# Patient Record
Sex: Male | Born: 1995 | Race: Black or African American | Hispanic: No | Marital: Single | Smoking: Never smoker
Health system: Southern US, Community
[De-identification: ages and names within clinical notes are randomized; demographics above are authoritative.]

## PROBLEM LIST (undated history)

## (undated) HISTORY — PX: WISDOM TOOTH EXTRACTION: SHX21

---
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
## 2018-01-26 ENCOUNTER — Emergency Department (HOSPITAL_BASED_OUTPATIENT_CLINIC_OR_DEPARTMENT_OTHER)
Admission: EM | Admit: 2018-01-26 | Discharge: 2018-01-26 | Disposition: A | Discharge status: Home / Self Care | Payer: PRIVATE HEALTH INSURANCE | Attending: Emergency Medicine | Admitting: Emergency Medicine

## 2018-01-26 ENCOUNTER — Encounter (HOSPITAL_BASED_OUTPATIENT_CLINIC_OR_DEPARTMENT_OTHER): Payer: Self-pay | Admitting: *Deleted

## 2018-01-26 ENCOUNTER — Emergency Department (HOSPITAL_BASED_OUTPATIENT_CLINIC_OR_DEPARTMENT_OTHER): Payer: PRIVATE HEALTH INSURANCE

## 2018-01-26 ENCOUNTER — Other Ambulatory Visit: Payer: Self-pay

## 2018-01-26 DIAGNOSIS — M25562 Pain in left knee: Secondary | ICD-10-CM | POA: Diagnosis not present

## 2018-01-26 MED ORDER — SULFAMETHOXAZOLE-TRIMETHOPRIM 800-160 MG PO TABS: 1.0000 | ORAL_TABLET | Freq: Two times a day (BID) | ORAL | 0 refills | Status: AC

## 2018-01-26 MED ORDER — IBUPROFEN 400 MG PO TABS: 600.0000 mg | ORAL_TABLET | Freq: Once | ORAL | Status: DC

## 2018-01-26 MED ORDER — LIDOCAINE-EPINEPHRINE 2 %-1:100000 IJ SOLN
INTRAMUSCULAR | Status: AC
  Filled 2018-01-26: qty 1

## 2018-01-26 MED ORDER — BENZOCAINE 20 % MT AERO
INHALATION_SPRAY | OROMUCOSAL | Status: AC
  Filled 2018-01-26: qty 57

## 2018-01-26 MED FILL — SULFAMETHOXAZOLE-TMP DS TAB: 800-160 | 5 days supply | Qty: 10 | Fill #0

## 2018-01-26 NOTE — ED Notes (Signed)
ED Provider at bedside. 

## 2018-01-26 NOTE — ED Provider Notes (Addendum)
[facility] EMERGENCY DEPARTMENT Provider Note   CSN: [id number] Arrival date & time: 01/26/18  1040     History   Chief Complaint Chief Complaint  Patient presents with  . Knee Pain    HPI Manuel Ritter is a 22 y.o. male.  HPI 22 year old African-American male with no pertinent past medical history presents to the ED for evaluation of pain, redness and swelling to his left knee.  Patient noted a pimple to his left knee that he popped a few days ago.  Patient states he did have some drainage from the area.  He states that since then he developed some erythema and warmth over his left lateral knee.  He does have some pain with range of motion of the knee.  Denies any systemic symptoms of fever, chills, nausea, vomiting.  She is able to walk and bend his knee however does cause some pain.  He is not taking for the pain prior to arrival.  Range of motion and palpation make the pain worse.  Holding the leg straight makes the pain better.  Denies any associated paresthesias or weakness.  States that he has had this happen in the past that improved on its own. History reviewed. No pertinent past medical history.  There are no active problems to display for this patient.   Past Surgical History:  Procedure Laterality Date  . ORIF MANDIBULAR FRACTURE Bilateral 09/13/2013   Procedure: OPEN REDUCTION MAXILLO-MANDIBULAR FIXATION;  Surgeon: Francene Finders, DDS;  Location: [facility] OR;  Service: Oral Surgery;  Laterality: Bilateral;  . WISDOM TOOTH EXTRACTION     all 4        Home Medications    Prior to Admission medications   Medication Sig Start Date End Date Taking? Authorizing Provider  sulfamethoxazole-trimethoprim (BACTRIM DS,SEPTRA DS) 800-160 MG tablet Take 1 tablet by mouth 2 (two) times daily. 01/26/18   Rise Mu, PA-C    Family History History reviewed. No pertinent family history.  Social History Social History   Tobacco Use  . Smoking status:  Never Smoker  . Smokeless tobacco: Never Used  Substance Use Topics  . Alcohol use: No  . Drug use: No     Allergies   Patient has no known allergies.   Review of Systems Review of Systems  Constitutional: Negative for chills and fever.  Gastrointestinal: Negative for vomiting.  Musculoskeletal: Positive for arthralgias, joint swelling and myalgias.  Skin: Positive for color change and wound.  Neurological: Negative for weakness and numbness.     Physical Exam Updated Vital Signs BP (!) 141/88 (BP Location: Left Arm)   Pulse 99   Temp 99.5 F (37.5 C) (Oral)   Resp 18   Ht 5\' 6"  (1.676 m)   Wt 63.5 kg (140 lb)   SpO2 100%   BMI 22.60 kg/m   Physical Exam  Constitutional: He appears well-developed and well-nourished. No distress.  HENT:  Head: Normocephalic and atraumatic.  Eyes: Right eye exhibits no discharge. Left eye exhibits no discharge. No scleral icterus.  Neck: Normal range of motion.  Pulmonary/Chest: No respiratory distress.  Musculoskeletal: Normal range of motion.  Patient has a small pustule to the left knee without any drainage.  She he does have associated erythema and warmth of the left lateral aspect of the knee.  Patient does have range of motion however does cause some pain.  No redness or warmth on the medial aspect of the knee.  No lower extremity edema or calf  tenderness.  DP pulses 2+ bilaterally.  Sensation intact with brisk cap refill.  There is no area of fluctuance concerning for an abscess.  Neurological: He is alert.  Skin: Skin is warm and dry. Capillary refill takes less than 2 seconds. No pallor.  As noted in msk  Psychiatric: His behavior is normal. Judgment and thought content normal.  Nursing note and vitals reviewed.    ED Treatments / Results  Labs (all labs ordered are listed, but only abnormal results are displayed) Labs Reviewed - No data to display  EKG  EKG Interpretation None       Radiology Dg Knee Complete 4  Views Left  Result Date: 01/26/2018 CLINICAL DATA:  Acute left knee pain without known injury. EXAM: LEFT KNEE - COMPLETE 4+ VIEW COMPARISON:  None. FINDINGS: No evidence of fracture, dislocation, or joint effusion. No evidence of arthropathy or other focal bone abnormality. Soft tissues are unremarkable. IMPRESSION: Normal left knee. Electronically Signed   By: Lupita Raider, M.D.   On: 01/26/2018 13:12    Procedures .Joint Aspiration/Arthrocentesis Date/Time: 01/26/2018 5:05 PM Performed by: Rise Mu, PA-C Authorized by: Rise Mu, PA-C   Consent:    Consent obtained:  Verbal and written   Consent given by:  Patient   Risks discussed:  Bleeding, incomplete drainage, nerve damage, infection, pain and poor cosmetic result   Alternatives discussed:  No treatment Location:    Location:  Knee   Knee:  L knee Anesthesia (see MAR for exact dosages):    Anesthesia method:  Local infiltration   Local anesthetic:  Lidocaine 1% WITH epi Procedure details:    Preparation: Patient was prepped and draped in usual sterile fashion     Needle gauge:  22 G   Ultrasound guidance: no     Approach:  Medial   Aspirate amount:  0   Aspirate characteristics:  Serous   Steroid injected: no     Specimen collected: no   Post-procedure details:    Dressing:  Adhesive bandage   Patient tolerance of procedure:  Tolerated well, no immediate complications    (including critical care time)  Medications Ordered in ED Medications  Benzocaine (HURRCAINE) 20 % mouth spray (not administered)  lidocaine-EPINEPHrine (XYLOCAINE W/EPI) 2 %-1:100000 (with pres) injection (not administered)  ibuprofen (ADVIL,MOTRIN) tablet 600 mg (not administered)     Initial Impression / Assessment and Plan / ED Course  I have reviewed the triage vital signs and the nursing notes.  Pertinent labs & imaging results that were available during my care of the patient were reviewed by me and considered in my  medical decision making (see chart for details).     Patient resents to the ED with complaints of swelling and redness to his left lateral knee after popping a pimple a few days ago.  Patient reports history of same that resolved on its own.  Denies any associated systemic symptoms of fever, chills.  Patient is overall well-appearing and nontoxic.  He is afebrile in the ED.  No signs of Sirs or sepsis.  On exam patient does have some erythema and warmth to the left lateral knee with a small superficial pustule noted.  No purulent drainage.  No significant fluctuance concerning for abscess.  Patient does have some range of motion the left knee without any pain.  Initially was concern for possible septic arthritis.  I performed an x-ray that showed no acute abnormalities including no large effusion.  Patient had no  effusion on exam.  I did perform an arthrocentesis with my attending under sterile technique.  Discussed risk first benefits with patient and consent was obtained.  Please see procedure note.  Was not able to aspirate any synovial fluid.  Given there is no effusion on the x-ray and unable to produce any fluid from the aspiration I have low suspicion for septic arthritis at this time.  I will place patient on Bactrim to cover for cellulitis.  Have instructed patient to follow-up in 2 days for recheck and return sooner if symptoms worsen.  Pt is hemodynamically stable, in NAD, & able to ambulate in the ED. Evaluation does not show pathology that would require ongoing emergent intervention or inpatient treatment. I explained the diagnosis to the patient. Pain has been managed & has no complaints prior to dc. Pt is comfortable with above plan and is stable for discharge at this time. All questions were answered prior to disposition. Strict return precautions for f/u to the ED were discussed. Encouraged follow up with PCP.  Dicussed with Dr. Clarene DukeLittle who also saw pt and was present for procedure and is  agreeable with the above plan.   Final Clinical Impressions(s) / ED Diagnoses   Final diagnoses:  Acute pain of left knee    ED Discharge Orders        Ordered    sulfamethoxazole-trimethoprim (BACTRIM DS,SEPTRA DS) 800-160 MG tablet  2 times daily     01/26/18 1437       Rise MuLeaphart, Dia Donate T, PA-C 01/26/18 1721    Little, Ambrose Finlandachel Morgan, MD 01/27/18 1030    7[address] Sierra DriveLeaphart, Oseas Detty T, PA-C 02/01/18 2305    Little, Ambrose Finlandachel Morgan, MD 02/03/18 [phone]

## 2018-01-26 NOTE — ED Triage Notes (Signed)
Pt c/o left knee pain and swelling x 3 days , denies injury

## 2018-01-26 NOTE — ED Notes (Signed)
Patient transported to X-ray 

## 2018-01-26 NOTE — Discharge Instructions (Signed)
Please take antibiotic as prescribed.  Continue to use over-the-counter Motrin/Aleve and Tylenol for pain.  This will also help with swelling.  Keep ice on the affected area.  If you develop worsening redness, worsening pain or drainage return to the ED for evaluation.  Would like for you to have a recheck of this area and 2 days.

## 2018-08-18 IMAGING — CR DG KNEE COMPLETE 4+V*L*
4 series · 4 of 4 positions shown · non-contrast
Comparison: None.

CLINICAL DATA: Acute left knee pain without known injury.

EXAM:
LEFT KNEE - COMPLETE 4+ VIEW

[t knee ap left]
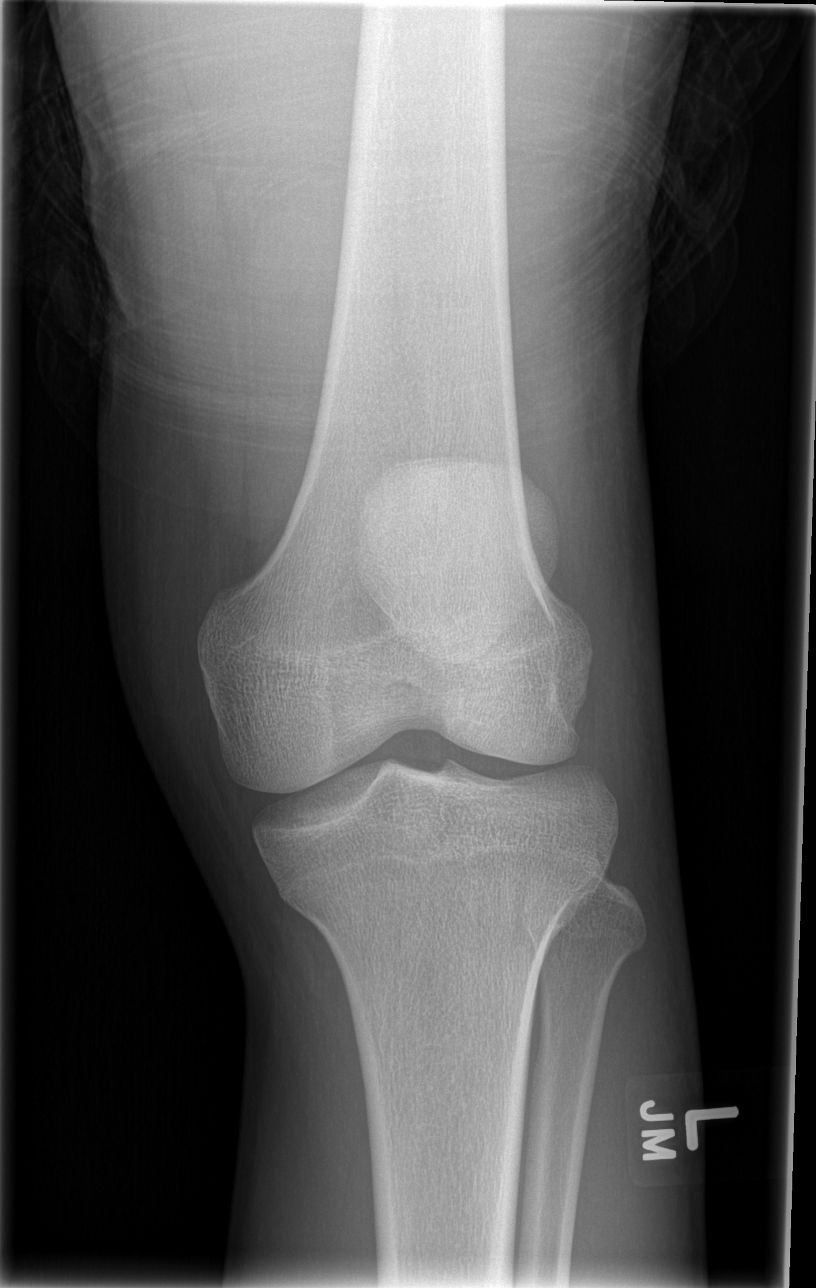

[t knee oblique left (1 of 2)]
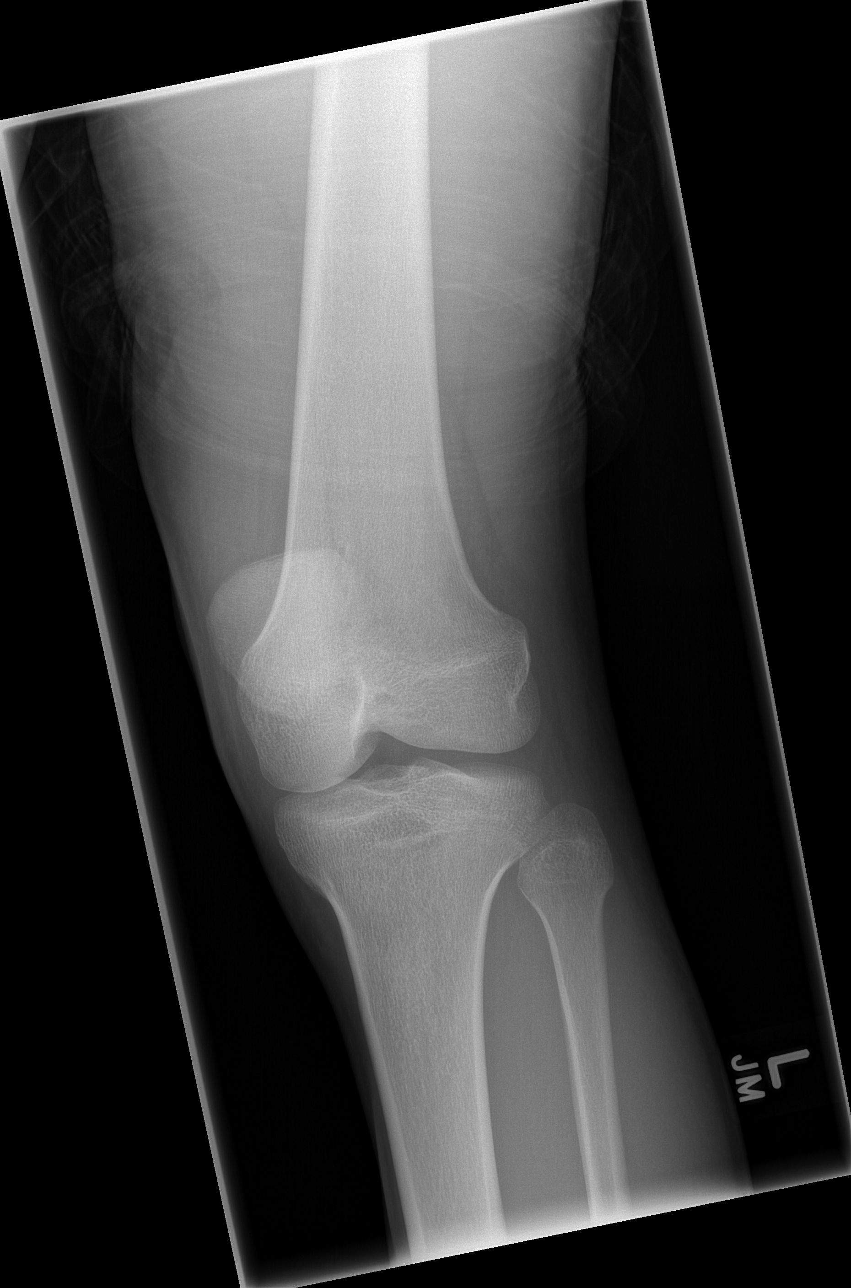

[t knee oblique left (2 of 2)]
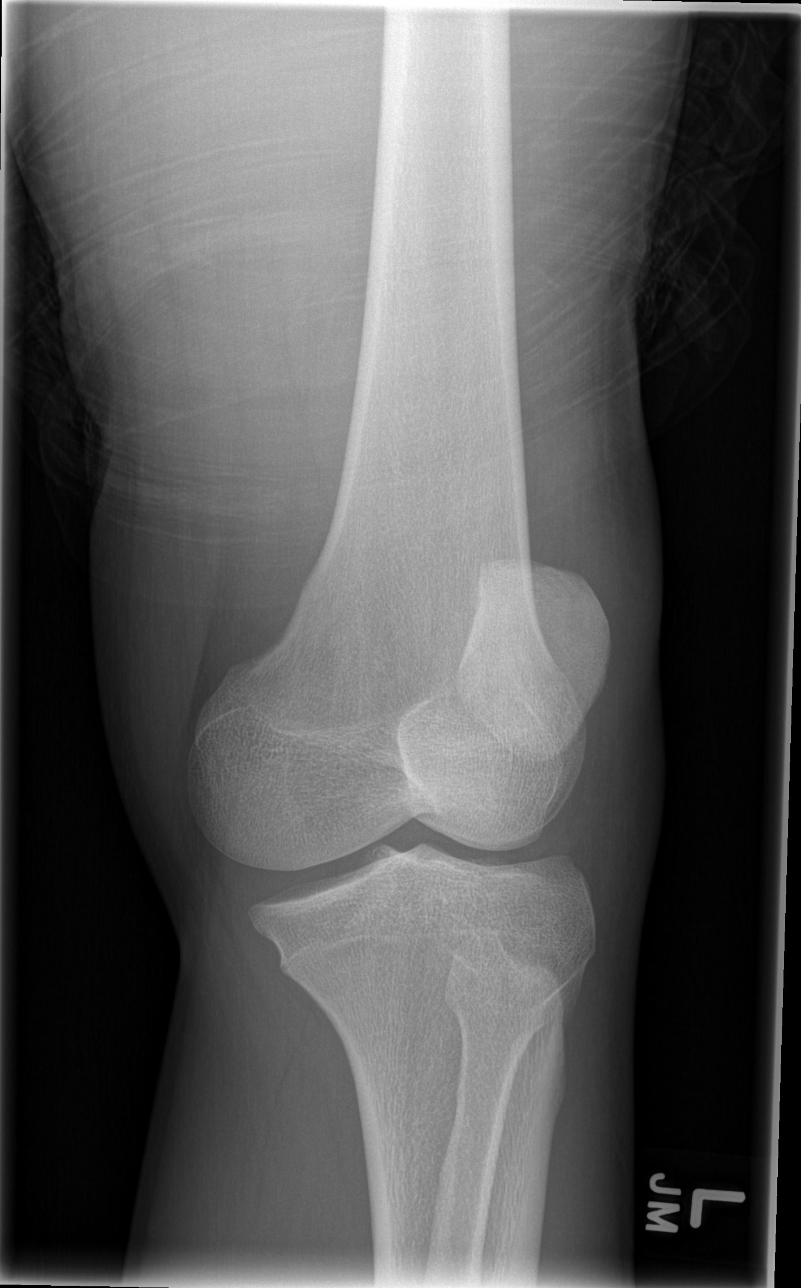

[t knee lat left]
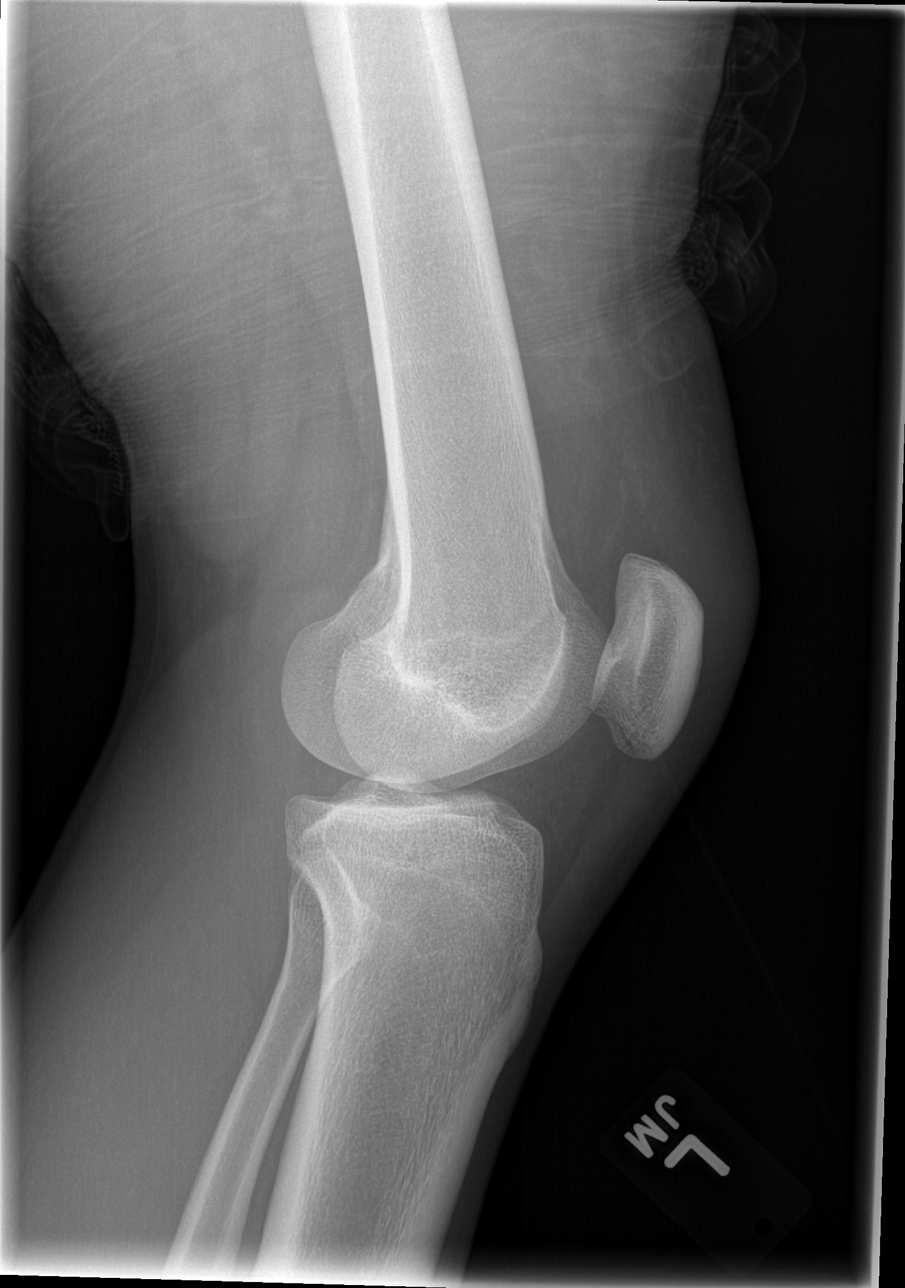

[4 of 4 positions shown; findings below may reference images not displayed]

FINDINGS: No evidence of fracture, dislocation, or joint effusion. No evidence
of arthropathy or other focal bone abnormality. Soft tissues are
unremarkable.
IMPRESSION: Normal left knee.

## 2021-07-29 ENCOUNTER — Other Ambulatory Visit: Payer: Self-pay

## 2021-07-29 ENCOUNTER — Encounter (HOSPITAL_COMMUNITY): Payer: Self-pay | Admitting: *Deleted

## 2021-07-29 ENCOUNTER — Emergency Department (HOSPITAL_COMMUNITY)
Admission: EM | Admit: 2021-07-29 | Discharge: 2021-07-29 | Disposition: A | Discharge status: Home / Self Care | Payer: 59 | Attending: Emergency Medicine | Admitting: Emergency Medicine

## 2021-07-29 DIAGNOSIS — J020 Streptococcal pharyngitis: Secondary | ICD-10-CM | POA: Insufficient documentation

## 2021-07-29 DIAGNOSIS — Z20822 Contact with and (suspected) exposure to covid-19: Secondary | ICD-10-CM | POA: Diagnosis not present

## 2021-07-29 DIAGNOSIS — R590 Localized enlarged lymph nodes: Secondary | ICD-10-CM | POA: Diagnosis not present

## 2021-07-29 DIAGNOSIS — J029 Acute pharyngitis, unspecified: Secondary | ICD-10-CM | POA: Diagnosis present

## 2021-07-29 LAB — RESP PANEL BY RT-PCR (FLU A&B, COVID) ARPGX2
Influenza A by PCR: NEGATIVE
Influenza B by PCR: NEGATIVE
SARS Coronavirus 2 by RT PCR: NEGATIVE

## 2021-07-29 LAB — GROUP A STREP BY PCR: Group A Strep by PCR: DETECTED — AB

## 2021-07-29 MED ORDER — CEPHALEXIN 500 MG PO CAPS: 500.0000 mg | ORAL_CAPSULE | Freq: Two times a day (BID) | ORAL | 0 refills | Status: AC

## 2021-07-29 MED ORDER — CEPHALEXIN 500 MG PO CAPS
500.0000 mg | ORAL_CAPSULE | Freq: Once | ORAL | Status: AC
  Administered 2021-07-29: 500 mg via ORAL
  Filled 2021-07-29: qty 1

## 2021-07-29 NOTE — ED Triage Notes (Signed)
Pt complains of sore throat x 2 days. He had negative COVID test at home 1 day ago.

## 2021-07-29 NOTE — ED Provider Notes (Signed)
[facility]-EMERGENCY DEPT Provider Note   CSN: [id number] Arrival date & time: 07/29/21  1423     History Chief Complaint  Patient presents with   Sore Throat    Manuel Ritter is a 25 y.o. male who presents with 3 days of sore throat without any other systemic symptoms.  She tested negative for COVID and he is COVID vaccinated.  I personally reviewed this patient's medical records.  History of ORIF of a mandibular fracture, with some tooth extraction, is not on any medications every day.  HPI     History reviewed. No pertinent past medical history.  There are no problems to display for this patient.   Past Surgical History:  Procedure Laterality Date   ORIF MANDIBULAR FRACTURE Bilateral 09/13/2013   Procedure: OPEN REDUCTION MAXILLO-MANDIBULAR FIXATION;  Surgeon: Francene Finders, DDS;  Location: [facility] OR;  Service: Oral Surgery;  Laterality: Bilateral;   WISDOM TOOTH EXTRACTION     all 4        No family history on file.  Social History   Tobacco Use   Smoking status: Never   Smokeless tobacco: Never  Substance Use Topics   Alcohol use: No   Drug use: No    Home Medications Prior to Admission medications   Medication Sig Start Date End Date Taking? Authorizing Provider  cephALEXin (KEFLEX) 500 MG capsule Take 1 capsule (500 mg total) by mouth 2 (two) times daily for 10 days. 07/29/21 08/08/21 Yes Mane Consolo, Lupe Carney R, PA-C  sulfamethoxazole-trimethoprim (BACTRIM DS,SEPTRA DS) 800-160 MG tablet Take 1 tablet by mouth 2 (two) times daily. 01/26/18   Rise Mu, PA-C    Allergies    Patient has no known allergies.  Review of Systems   Review of Systems  Constitutional: Negative.   HENT:  Positive for sore throat. Negative for congestion, ear discharge, sinus pressure, sinus pain, trouble swallowing and voice change.   Eyes: Negative.  Negative for visual disturbance.  Respiratory: Negative.    Cardiovascular: Negative.    Gastrointestinal: Negative.   Musculoskeletal: Negative.   Neurological:  Positive for headaches. Negative for dizziness, weakness and light-headedness.   Physical Exam Updated Vital Signs BP 119/81   Pulse 87   Temp 99 F (37.2 C) (Oral)   Resp 18   SpO2 99%   Physical Exam Vitals and nursing note reviewed.  Constitutional:      Appearance: He is normal weight. He is not ill-appearing or toxic-appearing.  HENT:     Head: Normocephalic and atraumatic.     Nose: Nose normal. No congestion.     Mouth/Throat:     Mouth: Mucous membranes are moist.     Pharynx: Oropharynx is clear. Uvula midline. No oropharyngeal exudate or posterior oropharyngeal erythema.     Tonsils: Tonsillar exudate present. No tonsillar abscesses. 2+ on the right. 2+ on the left.     Comments: Erythematous, beefy red tonsils bilaterally with exudate without evidence of abscess, uvular deviation, sublingual or submental tenderness to palpation.  Oropharynx is patent, patient tolerating his own secretions. Eyes:     General:        Right eye: No discharge.        Left eye: No discharge.     Extraocular Movements: Extraocular movements intact.     Conjunctiva/sclera: Conjunctivae normal.     Pupils: Pupils are equal, round, and reactive to light.  Neck:     Trachea: Trachea and phonation normal.  Cardiovascular:  Rate and Rhythm: Normal rate and regular rhythm.     Pulses: Normal pulses.     Heart sounds: Normal heart sounds. No murmur heard. Pulmonary:     Effort: Pulmonary effort is normal. No respiratory distress.     Breath sounds: Normal breath sounds. No wheezing or rales.  Abdominal:     General: Bowel sounds are normal. There is no distension.     Palpations: Abdomen is soft.     Tenderness: There is no abdominal tenderness. There is no guarding or rebound.  Musculoskeletal:        General: No deformity.     Cervical back: Normal range of motion and neck supple. No edema, rigidity or  crepitus. No pain with movement, spinous process tenderness or muscular tenderness.     Right lower leg: No edema.     Left lower leg: No edema.  Lymphadenopathy:     Cervical: Cervical adenopathy present.     Right cervical: Superficial cervical adenopathy present.     Left cervical: No superficial cervical adenopathy.  Skin:    General: Skin is warm and dry.     Capillary Refill: Capillary refill takes less than 2 seconds.  Neurological:     General: No focal deficit present.     Mental Status: He is alert and oriented to person, place, and time. Mental status is at baseline.  Psychiatric:        Mood and Affect: Mood normal.    ED Results / Procedures / Treatments   Labs (all labs ordered are listed, but only abnormal results are displayed) Labs Reviewed  GROUP A STREP BY PCR - Abnormal; Notable for the following components:      Result Value   Group A Strep by PCR DETECTED (*)    All other components within normal limits  RESP PANEL BY RT-PCR (FLU A&B, COVID) [id number]    EKG None  Radiology No results found.  Procedures Procedures   Medications Ordered in ED Medications  cephALEXin (KEFLEX) capsule 500 mg (500 mg Oral Given 07/29/21 1952)    ED Course  I have reviewed the triage vital signs and the nursing notes.  Pertinent labs & imaging results that were available during my care of the patient were reviewed by me and considered in my medical decision making (see chart for details).    MDM Rules/Calculators/A&P                         25 year old male presents with sore throat x3 days.  COVID vaccinated.  Differential diagnosis includes is not limited to streptococcal pharyngitis, viral pharyngitis secondary to COVID-19, influenza A/B, other acute viral illness, laryngitis, PTA, retropharyngeal abscess, Ludwig's angina, angioedema.  Vital signs are normal intake.  Cardiopulmonary dam is normal, abdominal exam is benign.  Patient does have right-sided shotty  superficial anterior cervical lymphadenopathy without tracheal deviation or hoarseness of voice.  Oropharyngeal exam revealed erythematous, edematous tonsils bilaterally with exudate without sign of oropharyngeal abscess.  No sublingual or submental tenderness palpation to suggest Ludwick's angina.  No anterior neck crepitus or tenderness to palpation.  Respiratory pathogen panel negative, strep by PCR positive for group A strep pharyngitis.  First dose of antibiotic administered in the emergency department and patient will be discharged with 10 days of antibiotic in the outpatient setting.  No further work-up warranted in ED at this time.  Manuel Ritter voiced understanding of his medical evaluation and treatment plan.  Each of  his questions answered to his expressed affection.  Return precautions given.  Patient is well-appearing, stable, and appropriate for discharge at this time.  This chart was dictated using voice recognition software, Dragon. Despite the best efforts of this provider to proofread and correct errors, errors may still occur which can change documentation meaning.   Final Clinical Impression(s) / ED Diagnoses Final diagnoses:  Strep pharyngitis    Rx / DC Orders ED Discharge Orders          Ordered    cephALEXin (KEFLEX) 500 MG capsule  2 times daily        07/29/21 1952             Ifeanyi Mickelson, Eugene Gavia, PA-C 07/29/21 2230    Mancel Bale, MD 07/30/21 1451

## 2021-07-29 NOTE — Discharge Instructions (Addendum)
You are seen in the ER today for sore throat.  You were diagnosed with strep throat.  You tested negative for COVID.  You have been prescribed an antibiotic to take twice a day for the next 10 days to relieve your strep infection.  Please take this as prescribed for the entire course.  May utilize Tylenol or ibuprofen as needed over-the-counter for your pain.  Continue to eat and drink.  Return to ER if develop any nausea vomiting that does not stop, or if you are unable to swallow liquids, or you develop any other new severe symptoms.
